# Patient Record
Sex: Female | Born: 2005 | Race: White | Hispanic: No | Marital: Single | Smoking: Never smoker
Health system: Southern US, Community
[De-identification: ages and names within clinical notes are randomized; demographics above are authoritative.]

---
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
## 2024-07-03 ENCOUNTER — Inpatient Hospital Stay

## 2024-07-03 ENCOUNTER — Inpatient Hospital Stay: Attending: Family | Admitting: Family

## 2024-07-03 VITALS — BP 147/74 | HR 74 | Temp 98.1°F | Resp 14 | Wt 155.2 lb

## 2024-07-03 DIAGNOSIS — D508 Other iron deficiency anemias: Secondary | ICD-10-CM

## 2024-07-03 DIAGNOSIS — D509 Iron deficiency anemia, unspecified: Secondary | ICD-10-CM | POA: Diagnosis present

## 2024-07-03 NOTE — Progress Notes (Addendum)
 Hematology/Oncology Consultation   Name: Kristine Wilson      MRN: [id number]    Location: Room/bed info not found  Date: 07/03/2024 Time:10:45 AM   REFERRING PHYSICIAN:  Candace McFail, FNP  REASON FOR CONSULT:  Iron deficiency anemia    DIAGNOSIS: Iron deficiency   HISTORY OF PRESENT ILLNESS: Kristine Wilson is present at [facility] for out virtual visit today. She is a pleasant 18 yo female with recent iron deficiency without anemia.  Ferritin was quite low at only 8 and total iron on 30. Hgb remained stable at 12.7, MCV 82, platelets 261 and WBC count 8.5.  Folate was 16, B 12 471.  Her vitamin D was extremely low at 73. She was prescribed supplement 50,000 units once a week.  She notes fatigue, weakness, lightheadedness, syncope, DOE and numbness/tingling and pain at times in the right thigh.  Her mother has MS as well as IDA. She has received IV iron in the past. Mom had anaphylaxis with Venofer and now does Ferrlecit  with pre meds. Her maternal grandfather also has history of IDA.  She is vegetarian. Her appetite and hydration are good. Weight is stable.  She has been taking an oral iron supplement off and on.  Her cycle is regular with light flow. No other blood loss noted. No bruising or petechiae.  No history of EGD or colonoscopy.  She notes recurrent sinus infections with congestion and sore throat as well as a head to toe red rash that does not itch or burn.  No fever, chills, n/v, cough, chest pain, abdominal pain or changes in bowel or bladder habits.  No swelling in her extremities.  No smoking, ETOH or recreational drug use.  No history of diabetes or thyroid disease.  She is doing her best to stay active going with her dad to the gym 2-3 days a week.   ROS: All other 10 point review of systems is negative.   PAST MEDICAL HISTORY:   No past medical history on file.  ALLERGIES: No Known Allergies    MEDICATIONS:  Current Outpatient Medications on File Prior to Visit   Medication Sig Dispense Refill   acetaminophen  (TYLENOL ) 325 MG tablet Take 650 mg by mouth every 6 (six) hours as needed for mild pain.     cetirizine (ZYRTEC) 10 MG tablet Take 10 mg by mouth daily.     diphenhydrAMINE  (BENADRYL ) 12.5 MG/5ML liquid Take 10 mLs (25 mg total) by mouth every 8 (eight) hours as needed for itching. 118 mL 0   ibuprofen  (ADVIL ,MOTRIN ) 200 MG tablet Take 600 mg by mouth every 6 (six) hours as needed for fever or moderate pain.     loratadine (CLARITIN) 10 MG tablet Take 10 mg by mouth daily as needed for allergies.     predniSONE  (DELTASONE ) 10 MG tablet Take 6 tabs day one, 5 tabs day two, 4 tabs day three, etc 42 tablet 0   No current facility-administered medications on file prior to visit.     PAST SURGICAL HISTORY No past surgical history on file.  FAMILY HISTORY: No family history on file.  SOCIAL HISTORY:  reports that she has never smoked. She has never used smokeless tobacco. She reports that she does not drink alcohol and does not use drugs.  PERFORMANCE STATUS: The patient's performance status is 1 - Symptomatic but completely ambulatory  PHYSICAL EXAM: Most Recent Vital Signs: There were no vitals taken for this visit. BP (!) 151/77 (BP Location: Left Arm, Patient Position:  Sitting)   Pulse 74   Temp 98.1 F (36.7 C) (Oral)   Resp 14   Wt 155 lb 3.3 oz (70.4 kg)   SpO2 98%   General Appearance:    Alert, cooperative, no distress, appears stated age  Head:    Normocephalic, without obvious abnormality, atraumatic  Eyes:    PERRL, conjunctiva/corneas clear, EOM's intact, fundi    benign, both eyes        Throat:   Lips, mucosa, and tongue normal; teeth and gums normal  Neck:   Supple, symmetrical, trachea midline, no adenopathy;    thyroid:  no enlargement/tenderness/nodules; no carotid   bruit or JVD  Back:     Symmetric, no curvature, ROM normal, no CVA tenderness  Lungs:     Clear to auscultation bilaterally, respirations  unlabored  Chest Wall:    No tenderness or deformity   Heart:    Regular rate and rhythm, S1 and S2 normal, no murmur, rub   or gallop     Abdomen:     Soft, non-tender, bowel sounds active all four quadrants,    no masses, no organomegaly        Extremities:   Extremities normal, atraumatic, no cyanosis or edema     Skin:   Skin color, texture, turgor normal, no rashes or lesions  Lymph nodes:   Cervical, supraclavicular, and axillary nodes normal       LABORATORY DATA:  No results found for this or any previous visit (from the past 48 hours).    RADIOGRAPHY: No results found.     PATHOLOGY: None   ASSESSMENT/PLAN: Kristine Wilson is a pleasant 18 yo female with recent iron deficiency without anemia.  We will get her set up with 2 doses of Ferrlecit  with pre meds.  She will also start taking Zyrtec daily at bedtime and see if this helps prevent her sinus congestion and rash.  Follow-up in 6 weeks.   All questions were answered. The patient knows to call the clinic with any problems, questions or concerns. We can certainly see the patient much sooner if necessary.   Lauraine Pepper, NP

## 2024-07-16 ENCOUNTER — Inpatient Hospital Stay: Attending: Family

## 2024-07-16 VITALS — BP 112/68 | HR 76 | Temp 96.6°F | Resp 18

## 2024-07-16 DIAGNOSIS — D508 Other iron deficiency anemias: Secondary | ICD-10-CM

## 2024-07-16 DIAGNOSIS — E611 Iron deficiency: Secondary | ICD-10-CM | POA: Insufficient documentation

## 2024-07-16 MED ORDER — SODIUM CHLORIDE 0.9 % IV SOLN
125.0000 mg | Freq: Once | INTRAVENOUS | Status: AC
  Administered 2024-07-16: 125 mg via INTRAVENOUS
  Filled 2024-07-16: qty 10

## 2024-07-16 MED ORDER — FAMOTIDINE IN NACL 20-0.9 MG/50ML-% IV SOLN
20.0000 mg | Freq: Once | INTRAVENOUS | Status: AC
  Administered 2024-07-16: 20 mg via INTRAVENOUS
  Filled 2024-07-16: qty 50

## 2024-07-16 MED ORDER — DIPHENHYDRAMINE HCL 25 MG PO CAPS
25.0000 mg | ORAL_CAPSULE | Freq: Once | ORAL | Status: AC
  Administered 2024-07-16: 25 mg via ORAL
  Filled 2024-07-16: qty 1

## 2024-07-16 MED ORDER — SODIUM CHLORIDE 0.9 % IV SOLN: Freq: Once | INTRAVENOUS | Status: AC

## 2024-07-16 MED ORDER — METHYLPREDNISOLONE SODIUM SUCC 125 MG IJ SOLR
60.0000 mg | Freq: Once | INTRAMUSCULAR | Status: AC
  Administered 2024-07-16: 60 mg via INTRAVENOUS
  Filled 2024-07-16: qty 2

## 2024-07-16 NOTE — Patient Instructions (Signed)
Sodium Ferric Gluconate Complex Injection What is this medication? SODIUM FERRIC GLUCONATE COMPLEX (SOE dee um FER ik GLOO koe nate KOM pleks) treats low levels of iron (iron deficiency anemia) in people with kidney disease. Iron is a mineral that plays an important role in making red blood cells, which carry oxygen from your lungs to the rest of your body. This medicine may be used for other purposes; ask your health care provider or pharmacist if you have questions. COMMON BRAND NAME(S): Ferrlecit, Nulecit What should I tell my care team before I take this medication? They need to know if you have any of the following conditions: Anemia that is not from iron deficiency High levels of iron in the blood An unusual or allergic reaction to iron, other medications, foods, dyes, or preservatives Pregnant or are trying to become pregnant Breast-feeding How should I use this medication? This medication is injected into a vein. It is given by your care team in a hospital or clinic setting. Talk to your care team about the use of this medication in children. While it may be prescribed for children as young as 6 years for selected conditions, precautions do apply. Overdosage: If you think you have taken too much of this medicine contact a poison control center or emergency room at once. NOTE: This medicine is only for you. Do not share this medicine with others. What if I miss a dose? It is important not to miss your dose. Call your care team if you are unable to keep an appointment. What may interact with this medication? Do not take this medication with any of the following: Deferasirox Deferoxamine Dimercaprol This medication may also interact with the following: Other iron products This list may not describe all possible interactions. Give your health care provider a list of all the medicines, herbs, non-prescription drugs, or dietary supplements you use. Also tell them if you smoke, drink  alcohol, or use illegal drugs. Some items may interact with your medicine. What should I watch for while using this medication? Your condition will be monitored carefully while you are receiving this medication. Visit your care team for regular checks on your progress. You may need blood work while you are taking this medication. What side effects may I notice from receiving this medication? Side effects that you should report to your care team as soon as possible: Allergic reactions--skin rash, itching, hives, swelling of the face, lips, tongue, or throat Low blood pressure--dizziness, feeling faint or lightheaded, blurry vision Shortness of breath Side effects that usually do not require medical attention (report to your care team if they continue or are bothersome): Flushing Headache Joint pain Muscle pain Nausea Pain, redness, or irritation at injection site This list may not describe all possible side effects. Call your doctor for medical advice about side effects. You may report side effects to FDA at 1-800-FDA-1088. Where should I keep my medication? This medication is given in a hospital or clinic and will not be stored at home. NOTE: This sheet is a summary. It may not cover all possible information. If you have questions about this medicine, talk to your doctor, pharmacist, or health care provider.  2024 Elsevier/Gold Standard (2020-12-18 00:00:00)

## 2024-07-16 NOTE — Progress Notes (Signed)
 Patient tolerated iron  infusion with no complaints voiced.  Peripheral IV site clean and dry with good blood return noted before and after infusion.  Pt observed for 30 minutes post iron  infusion without any complications. VSS with discharge and left in satisfactory condition with no s/s of distress noted. All follow ups as scheduled.  Kristine Wilson

## 2024-07-23 ENCOUNTER — Inpatient Hospital Stay

## 2024-07-23 VITALS — BP 101/63 | HR 68 | Temp 96.9°F | Resp 17

## 2024-07-23 DIAGNOSIS — D508 Other iron deficiency anemias: Secondary | ICD-10-CM

## 2024-07-23 DIAGNOSIS — E611 Iron deficiency: Secondary | ICD-10-CM | POA: Diagnosis not present

## 2024-07-23 MED ORDER — SODIUM CHLORIDE 0.9 % IV SOLN: INTRAVENOUS | Status: DC

## 2024-07-23 MED ORDER — FAMOTIDINE IN NACL 20-0.9 MG/50ML-% IV SOLN
20.0000 mg | Freq: Once | INTRAVENOUS | Status: AC
  Administered 2024-07-23: 09:00:00 20 mg via INTRAVENOUS
  Filled 2024-07-23: qty 50

## 2024-07-23 MED ORDER — DIPHENHYDRAMINE HCL 25 MG PO CAPS
25.0000 mg | ORAL_CAPSULE | Freq: Once | ORAL | Status: AC
  Administered 2024-07-23: 09:00:00 25 mg via ORAL
  Filled 2024-07-23: qty 1

## 2024-07-23 MED ORDER — METHYLPREDNISOLONE SODIUM SUCC 125 MG IJ SOLR
60.0000 mg | Freq: Once | INTRAMUSCULAR | Status: AC
  Administered 2024-07-23: 09:00:00 60 mg via INTRAVENOUS
  Filled 2024-07-23: qty 2

## 2024-07-23 MED ORDER — SODIUM CHLORIDE 0.9 % IV SOLN
125.0000 mg | Freq: Once | INTRAVENOUS | Status: AC
  Administered 2024-07-23: 10:00:00 125 mg via INTRAVENOUS
  Filled 2024-07-23: qty 125

## 2024-07-23 NOTE — Progress Notes (Signed)
 Patient tolerated iron  infusion with no complaints voiced.  Peripheral IV site clean and dry with good blood return noted before and after infusion.  Pt observed for 30 minutes post iron  infusion without any complications. VSS with discharge and left in satisfactory condition with no s/s of distress noted. All follow ups as scheduled.  Adamariz Gillott

## 2024-07-23 NOTE — Patient Instructions (Signed)
Sodium Ferric Gluconate Complex Injection What is this medication? SODIUM FERRIC GLUCONATE COMPLEX (SOE dee um FER ik GLOO koe nate KOM pleks) treats low levels of iron (iron deficiency anemia) in people with kidney disease. Iron is a mineral that plays an important role in making red blood cells, which carry oxygen from your lungs to the rest of your body. This medicine may be used for other purposes; ask your health care provider or pharmacist if you have questions. COMMON BRAND NAME(S): Ferrlecit, Nulecit What should I tell my care team before I take this medication? They need to know if you have any of the following conditions: Anemia that is not from iron deficiency High levels of iron in the blood An unusual or allergic reaction to iron, other medications, foods, dyes, or preservatives Pregnant or are trying to become pregnant Breast-feeding How should I use this medication? This medication is injected into a vein. It is given by your care team in a hospital or clinic setting. Talk to your care team about the use of this medication in children. While it may be prescribed for children as young as 6 years for selected conditions, precautions do apply. Overdosage: If you think you have taken too much of this medicine contact a poison control center or emergency room at once. NOTE: This medicine is only for you. Do not share this medicine with others. What if I miss a dose? It is important not to miss your dose. Call your care team if you are unable to keep an appointment. What may interact with this medication? Do not take this medication with any of the following: Deferasirox Deferoxamine Dimercaprol This medication may also interact with the following: Other iron products This list may not describe all possible interactions. Give your health care provider a list of all the medicines, herbs, non-prescription drugs, or dietary supplements you use. Also tell them if you smoke, drink  alcohol, or use illegal drugs. Some items may interact with your medicine. What should I watch for while using this medication? Your condition will be monitored carefully while you are receiving this medication. Visit your care team for regular checks on your progress. You may need blood work while you are taking this medication. What side effects may I notice from receiving this medication? Side effects that you should report to your care team as soon as possible: Allergic reactions--skin rash, itching, hives, swelling of the face, lips, tongue, or throat Low blood pressure--dizziness, feeling faint or lightheaded, blurry vision Shortness of breath Side effects that usually do not require medical attention (report to your care team if they continue or are bothersome): Flushing Headache Joint pain Muscle pain Nausea Pain, redness, or irritation at injection site This list may not describe all possible side effects. Call your doctor for medical advice about side effects. You may report side effects to FDA at 1-800-FDA-1088. Where should I keep my medication? This medication is given in a hospital or clinic and will not be stored at home. NOTE: This sheet is a summary. It may not cover all possible information. If you have questions about this medicine, talk to your doctor, pharmacist, or health care provider.  2024 Elsevier/Gold Standard (2020-12-18 00:00:00)

## 2024-08-05 ENCOUNTER — Encounter: Payer: Self-pay | Admitting: *Deleted

## 2024-08-26 ENCOUNTER — Other Ambulatory Visit: Payer: Self-pay

## 2024-08-26 DIAGNOSIS — D508 Other iron deficiency anemias: Secondary | ICD-10-CM

## 2024-08-27 ENCOUNTER — Inpatient Hospital Stay: Attending: Family

## 2024-08-27 DIAGNOSIS — D508 Other iron deficiency anemias: Secondary | ICD-10-CM

## 2024-08-27 LAB — COMPREHENSIVE METABOLIC PANEL WITH GFR
ALT: 18 U/L (ref 0–44)
AST: 21 U/L (ref 15–41)
Albumin: 4.3 g/dL (ref 3.5–5.0)
Alkaline Phosphatase: 59 U/L (ref 38–126)
Anion gap: 12 (ref 5–15)
BUN: 7 mg/dL (ref 6–20)
CO2: 24 mmol/L (ref 22–32)
Calcium: 9.2 mg/dL (ref 8.9–10.3)
Chloride: 106 mmol/L (ref 98–111)
Creatinine, Ser: 0.55 mg/dL (ref 0.44–1.00)
GFR, Estimated: >60 mL/min
Glucose, Bld: 93 mg/dL (ref 70–99)
Potassium: 4.4 mmol/L (ref 3.5–5.1)
Sodium: 141 mmol/L (ref 135–145)
Total Bilirubin: 0.6 mg/dL (ref 0.0–1.2)
Total Protein: 6.7 g/dL (ref 6.5–8.1)

## 2024-08-27 LAB — CBC WITH DIFFERENTIAL/PLATELET
Abs Immature Granulocytes: 0.01 K/uL (ref 0.00–0.07)
Basophils Absolute: 0 K/uL (ref 0.0–0.1)
Basophils Relative: 1 %
Eosinophils Absolute: 0.1 K/uL (ref 0.0–0.5)
Eosinophils Relative: 2 %
HCT: 42.9 % (ref 36.0–46.0)
Hemoglobin: 13.4 g/dL (ref 12.0–15.0)
Immature Granulocytes: 0 %
Lymphocytes Relative: 36 %
Lymphs Abs: 1.6 K/uL (ref 0.7–4.0)
MCH: 26.4 pg (ref 26.0–34.0)
MCHC: 31.2 g/dL (ref 30.0–36.0)
MCV: 84.6 fL (ref 80.0–100.0)
Monocytes Absolute: 0.4 K/uL (ref 0.1–1.0)
Monocytes Relative: 8 %
Neutro Abs: 2.3 K/uL (ref 1.7–7.7)
Neutrophils Relative %: 53 %
Platelets: 179 K/uL (ref 150–400)
RBC: 5.07 MIL/uL (ref 3.87–5.11)
RDW: 13.8 % (ref 11.5–15.5)
WBC: 4.4 K/uL (ref 4.0–10.5)
nRBC: 0 % (ref 0.0–0.2)

## 2024-08-27 LAB — IRON AND TIBC
Iron: 96 ug/dL (ref 28–170)
Saturation Ratios: 27 % (ref 10.4–31.8)
TIBC: 354 ug/dL (ref 250–450)
UIBC: 259 ug/dL

## 2024-08-27 LAB — FERRITIN: Ferritin: 30 ng/mL (ref 11–307)

## 2024-09-02 NOTE — Assessment & Plan Note (Signed)
 Received 2 doses of ferlicit.

## 2024-09-02 NOTE — Progress Notes (Unsigned)
 "  [facility] OFFICE PROGRESS NOTE  Kristine Wilson BRAVO, MD  ASSESSMENT & PLAN:   I connected with Kristine Wilson on 09/03/24 at  9:20 AM EST by telephone visit and verified that I am speaking with the correct person using two identifiers.   I discussed the limitations, risks, security and privacy concerns of performing an evaluation and management service by telemedicine and the availability of in-person appointments. I also discussed with the patient that there may be a patient responsible charge related to this service. The patient expressed understanding and agreed to proceed.   Other persons participating in the visit and their role in the encounter: NP, Patient    Patients location: Home   Providers location: Home   Assessment & Plan Iron deficiency anemia secondary to inadequate dietary iron intake Received 2 doses of Ferrlecit  over the past month. Lab work shows improvement of her iron levels although they are still low. Recommend 1 additional dose of IV Ferrlecit  and 2 consistently take iron supplements every other day with vitamin C.  Return to clinic in 4 months with labs a few days before. Vitamin D deficiency Vitamin D level very low at 11. She has been taking ergocalciferol 50,000 units once per week with good tolerance. Will recheck vitamin D level at her next visit.  Orders Placed This Encounter  Procedures   Iron and TIBC ([facility] ONLY)    Standing Status:   Future    Expected Date:   01/01/2025    Expiration Date:   04/01/2025   Ferritin    Standing Status:   Future    Expected Date:   01/01/2025    Expiration Date:   04/01/2025   Comprehensive metabolic panel    Standing Status:   Future    Expected Date:   01/01/2025    Expiration Date:   04/01/2025   CBC with Differential    Standing Status:   Future    Expected Date:   01/01/2025    Expiration Date:   04/01/2025   VITAMIN D 25 Hydroxy (Vit-D Deficiency, Fractures)     Standing Status:   Future    Expected Date:   01/01/2025    Expiration Date:   09/03/2025   INTERVAL HISTORY:  Kristine Wilson is a 19 year old patient who is here for follow-up for iron deficiency anemia.  She received 2 doses of IV Ferrlecit  last month with good tolerance.  She is taking iron supplements every other day with vitamin C.  Patient had lab work drawn at [facility] in the past: Ferritin was 8 and total iron was 30.  Hemoglobin 12.7 MCV 82 with normal platelets and white blood cell count.  Folate and B12 were normal. Vitamin D level low at 11.  She has been compliant with her 50,000 units of vitamin D once per week.  Reports improvement of her energy levels following iron infusion.  She has been having less spells where her vision becomes blurred and she sees white since receiving iron infusion.  States they still occur but are much less than before.  SUMMARY OF HEMATOLOGIC HISTORY: Oncology History   No problem history exists.   Her mother has MS as well as IDA. She has received IV iron in the past. Mom had anaphylaxis with Venofer and now does Ferrlecit  with pre meds. Her maternal grandfather also has history of IDA.  She is vegetarian. Her appetite and hydration are good. Weight is stable.  Her  cycle is regular with light flow. No other blood loss noted. No bruising or petechiae.  No history of EGD or colonoscopy.   CBC    Component Value Date/Time   WBC 4.4 08/27/2024 0928   RBC 5.07 08/27/2024 0928   HGB 13.4 08/27/2024 0928   HCT 42.9 08/27/2024 0928   PLT 179 08/27/2024 0928   MCV 84.6 08/27/2024 0928   MCH 26.4 08/27/2024 0928   MCHC 31.2 08/27/2024 0928   RDW 13.8 08/27/2024 0928   LYMPHSABS 1.6 08/27/2024 0928   MONOABS 0.4 08/27/2024 0928   EOSABS 0.1 08/27/2024 0928   BASOSABS 0.0 08/27/2024 0928       Latest Ref Rng & Units 08/27/2024    9:28 AM 11/09/2018   11:38 AM 11/07/2018    5:16 AM  CMP  Glucose 70 - 99 mg/dL 93  899  880   BUN 6 - 20 mg/dL 7   7  8    Creatinine 0.44 - 1.00 mg/dL 9.44  9.61  9.45   Sodium 135 - 145 mmol/L 141  137  136   Potassium 3.5 - 5.1 mmol/L 4.4  3.7  4.0   Chloride 98 - 111 mmol/L 106  102  104   CO2 22 - 32 mmol/L 24  26  21    Calcium 8.9 - 10.3 mg/dL 9.2  8.8  9.1   Total Protein 6.5 - 8.1 g/dL 6.7  6.6  7.0   Total Bilirubin 0.0 - 1.2 mg/dL 0.6  0.5  0.6   Alkaline Phos 38 - 126 U/L 59  101  125   AST 15 - 41 U/L 21  18  22    ALT 0 - 44 U/L 18  13  12       Lab Results  Component Value Date   FERRITIN 30 08/27/2024    There were no vitals filed for this visit.  Review of System:  Review of Systems  Constitutional:  Negative for malaise/fatigue.  Neurological:  Positive for dizziness and headaches.    Physical Exam: Physical Exam Vitals reviewed.  Neurological:     Mental Status: She is alert.     I provided 20 minutes of non face-to-face telephone visit time during this encounter, and > 50% was spent counseling as documented under my assessment & plan.   Delon Hope, NP 09/03/2024 9:35 AM "

## 2024-09-03 ENCOUNTER — Inpatient Hospital Stay: Admitting: Oncology

## 2024-09-03 DIAGNOSIS — E559 Vitamin D deficiency, unspecified: Secondary | ICD-10-CM | POA: Diagnosis not present

## 2024-09-03 DIAGNOSIS — D508 Other iron deficiency anemias: Secondary | ICD-10-CM

## 2024-09-03 DIAGNOSIS — D500 Iron deficiency anemia secondary to blood loss (chronic): Secondary | ICD-10-CM

## 2024-09-03 NOTE — Assessment & Plan Note (Signed)
 Received 2 doses of Ferrlecit  over the past month. Lab work shows improvement of her iron levels although they are still low. Recommend 1 additional dose of IV Ferrlecit  and 2 consistently take iron supplements every other day with vitamin C.  Return to clinic in 4 months with labs a few days before.

## 2024-09-20 ENCOUNTER — Inpatient Hospital Stay: Attending: Family

## 2024-12-27 ENCOUNTER — Inpatient Hospital Stay

## 2025-01-03 ENCOUNTER — Inpatient Hospital Stay: Admitting: Oncology
